# Patient Record
Sex: Female | Born: 1935 | Race: White | Hispanic: No | Smoking: Former smoker
Health system: Southern US, Community
[De-identification: ages and names within clinical notes are randomized; demographics above are authoritative.]

## PROBLEM LIST (undated history)

## (undated) DIAGNOSIS — F0390 Unspecified dementia without behavioral disturbance: Secondary | ICD-10-CM

## (undated) HISTORY — PX: ABDOMINAL HYSTERECTOMY: SHX81

## (undated) HISTORY — PX: TOTAL HIP ARTHROPLASTY: SHX124

---

## 2004-08-21 ENCOUNTER — Ambulatory Visit: Payer: Self-pay | Admitting: Otolaryngology

## 2005-03-27 ENCOUNTER — Ambulatory Visit: Payer: Self-pay | Admitting: Internal Medicine

## 2006-12-02 ENCOUNTER — Other Ambulatory Visit: Payer: Self-pay

## 2006-12-02 ENCOUNTER — Observation Stay: Payer: Self-pay | Admitting: Internal Medicine

## 2008-03-07 ENCOUNTER — Ambulatory Visit: Payer: Self-pay | Admitting: Internal Medicine

## 2015-10-22 ENCOUNTER — Encounter: Payer: Self-pay | Admitting: Intensive Care

## 2015-10-22 ENCOUNTER — Emergency Department
Admission: EM | Admit: 2015-10-22 | Discharge: 2015-10-22 | Disposition: A | Discharge status: Home / Self Care | Payer: Medicare PPO | Attending: Emergency Medicine | Admitting: Emergency Medicine

## 2015-10-22 ENCOUNTER — Emergency Department: Payer: Medicare PPO

## 2015-10-22 DIAGNOSIS — Y999 Unspecified external cause status: Secondary | ICD-10-CM | POA: Insufficient documentation

## 2015-10-22 DIAGNOSIS — Y9389 Activity, other specified: Secondary | ICD-10-CM | POA: Insufficient documentation

## 2015-10-22 DIAGNOSIS — Z90710 Acquired absence of both cervix and uterus: Secondary | ICD-10-CM | POA: Insufficient documentation

## 2015-10-22 DIAGNOSIS — F0390 Unspecified dementia without behavioral disturbance: Secondary | ICD-10-CM | POA: Diagnosis not present

## 2015-10-22 DIAGNOSIS — Y92012 Bathroom of single-family (private) house as the place of occurrence of the external cause: Secondary | ICD-10-CM | POA: Insufficient documentation

## 2015-10-22 DIAGNOSIS — W01198A Fall on same level from slipping, tripping and stumbling with subsequent striking against other object, initial encounter: Secondary | ICD-10-CM | POA: Insufficient documentation

## 2015-10-22 DIAGNOSIS — S0990XA Unspecified injury of head, initial encounter: Secondary | ICD-10-CM | POA: Diagnosis not present

## 2015-10-22 DIAGNOSIS — Z87891 Personal history of nicotine dependence: Secondary | ICD-10-CM | POA: Diagnosis not present

## 2015-10-22 HISTORY — DX: Unspecified dementia, unspecified severity, without behavioral disturbance, psychotic disturbance, mood disturbance, and anxiety: F03.90

## 2015-10-22 NOTE — ED Notes (Signed)
Patient arrived by EMS from home. Patient has advanced dementia and constantly grinds her teethe. Patient was at home and had been placed on the toilet. Patient attempted to stand up by herself and fell tot he floor and hit her head. Patient has hematoma on L forehead. Family denies patient being on blood thinners or LOC.

## 2015-10-22 NOTE — ED Notes (Addendum)
Pt s/p fall with raised bruise over left eye. Pupils 4mm bilaterally and reactive. No other obvious injuries noted but pt is right side down and resists turning.  Hx dementia. Pt at baseline per daughter.

## 2015-10-22 NOTE — ED Provider Notes (Signed)
[facility] Department Provider Note  ____________________________________________   I have reviewed the triage vital signs and the nursing notes.   HISTORY  Chief Complaint Fall    HPI Erin NatterSarah V Carter is a 82 y.o. female who lives at home. Severe dementia. She is baseline nonverbal essentially. Patient had a witnessed fall from transfer at the toilet. Hit her head did not pass out. Patient herself cannot give a history. History per family. I thing in her baseline at this time. No injury noted aside from hematoma. Was able to bear weight for EMS.      Past Medical History  Diagnosis Date  . Dementia     There are no active problems to display for this patient.   Past Surgical History  Procedure Laterality Date  . Abdominal hysterectomy    . Total hip arthroplasty      family unsure which side    No current outpatient prescriptions on file.  Allergies Review of patient's allergies indicates no known allergies.  History reviewed. No pertinent family history.  Social History Social History  Substance Use Topics  . Smoking status: Former Games developermoker  . Smokeless tobacco: Never Used  . Alcohol Use: No    Review of Systems Cannot obtain second patient mental status  ____________________________________________   PHYSICAL EXAM:  VITAL SIGNS: ED Triage Vitals  Enc Vitals Group     BP 10/22/15 1552 162/79 mmHg     Pulse Rate 10/22/15 1546 85     Resp 10/22/15 1546 22     Temp 10/22/15 1546 97.7 F (36.5 C)     Temp Source 10/22/15 1546 Axillary     SpO2 10/22/15 1546 99 %     Weight 10/22/15 1546 160 lb 15 oz (73 kg)     Height --      Head Cir --      Peak Flow --      Pain Score --      Pain Loc --      Pain Edu? --      Excl. in GC? --     Constitutional: Alert in no acute distress, pleasantly demented Eyes: Conjunctivae are normal. PERRL. EOMI. Head: Hematoma noted to the left forehead no skull  fracture palpated. Nose: No congestion/rhinnorhea. Mouth/Throat: Mucous membranes are moist.  Oropharynx non-erythematous. Neck: No stridor.   Nontender with no meningismus Cardiovascular: Normal rate, regular rhythm. Grossly normal heart sounds.  Good peripheral circulation. Respiratory: Normal respiratory effort.  No retractions. Lungs CTAB. Abdominal: Soft and nontender. No distention. No guarding no rebound Back:  There is no focal tenderness or step off there is no midline tenderness there are no lesions noted. there is no CVA tenderness Musculoskeletal: No lower extremity tenderness. No joint effusions, no DVT signs strong distal pulses no edema Neurologic:  Normal speech and language. No gross focal neurologic deficits are appreciated.  Skin:  Skin is warm, dry and intact. No rash noted.  ____________________________________________   LABS (all labs ordered are listed, but only abnormal results are displayed)  Labs Reviewed - No data to display ____________________________________________  EKG  I personally interpreted any EKGs ordered by me or triage  ____________________________________________  RADIOLOGY  I reviewed any imaging ordered by me or triage that were performed during my shift and, if possible, patient and/or family made aware of any abnormal findings. ____________________________________________   PROCEDURES  Procedure(s) performed: None  Critical Care performed: None  ____________________________________________   INITIAL IMPRESSION /  ASSESSMENT AND PLAN / ED COURSE  Pertinent labs & imaging results that were available during my care of the patient were reviewed by me and considered in my medical decision making (see chart for details).  Non-syncopal fall no evidence of other injury aside from close head injury, CT negative, not on anticoagulation, able to bear weight, no evidence of hip fracture or other occult fracture but very difficult to  interpret this patient's exam. Extensive return precautions therefore given to family. They're very comfortable with discharge. Patient remains at her baseline and they're eager to go home. ____________________________________________   FINAL CLINICAL IMPRESSION(S) / ED DIAGNOSES  Final diagnoses:  Closed head injury, initial encounter      This chart was dictated using voice recognition software.  Despite best efforts to proofread,  errors can occur which can change meaning.      Jeanmarie Plant, MD 10/22/15 [phone]

## 2017-07-14 DEATH — deceased

## 2017-10-12 IMAGING — CT CT CERVICAL SPINE W/O CM
2 of 4 series · 11 of 29 positions shown, 14 images · non-contrast
Comparison: 12/02/2006 head CT

CLINICAL DATA: Fall with bruising over the left eye. Dementia.
Initial encounter.

EXAM:
CT HEAD WITHOUT CONTRAST
CT CERVICAL SPINE WITHOUT CONTRAST
TECHNIQUE: Multidetector CT imaging of the head and cervical spine was
performed following the standard protocol without intravenous
contrast. Multiplanar CT image reconstructions of the cervical spine
were also generated.

[Series 5: soft tissue · axial · 0.38mm/px · z∈[-260,-154]mm · 6 of 75 slices shown, 8 images]
[im 11/75  soft-tissue]
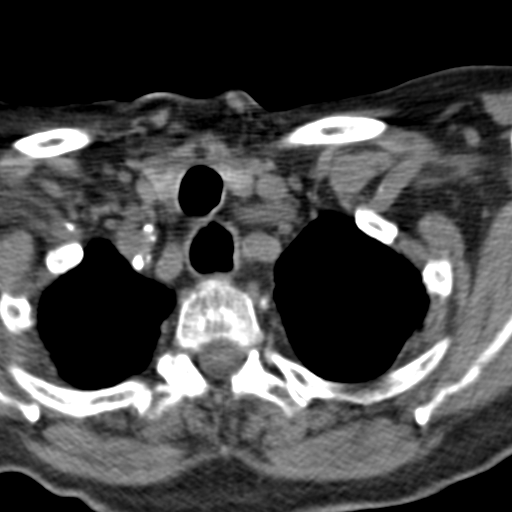
[im 11/75  bone]
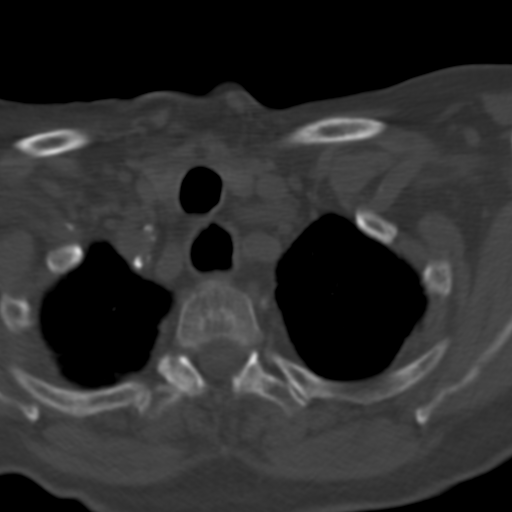
[im 22/75  bone]
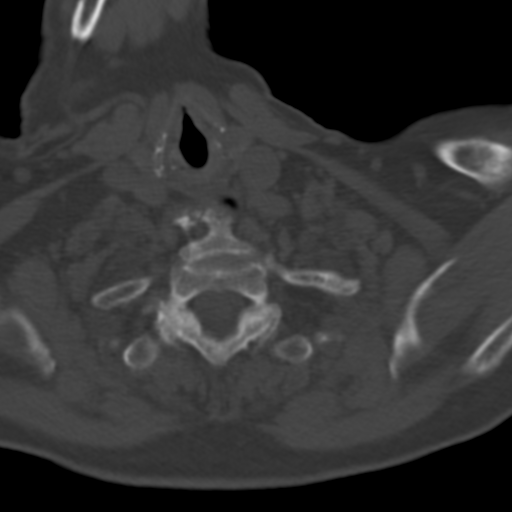
[im 32/75  bone]
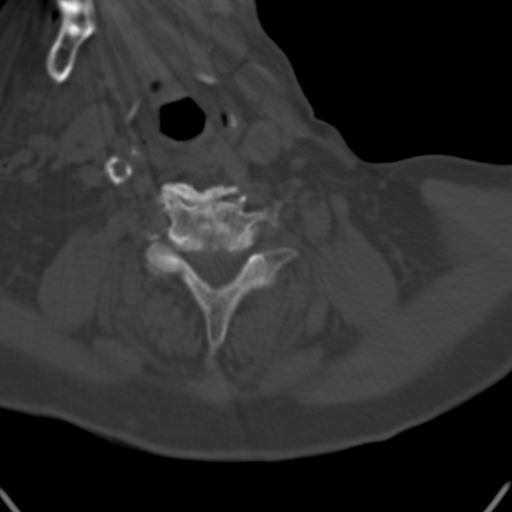
[im 43/75  bone]
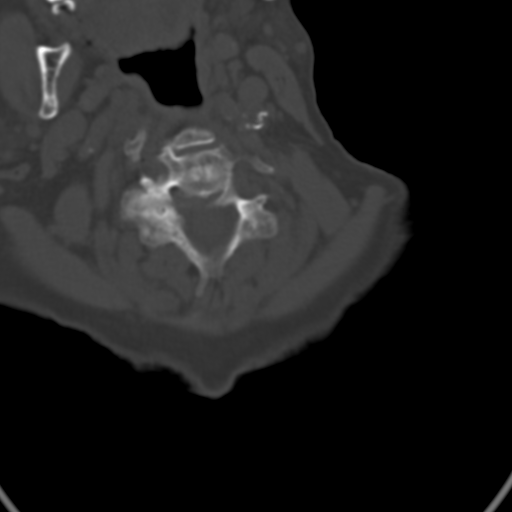
[im 53/75  soft-tissue]
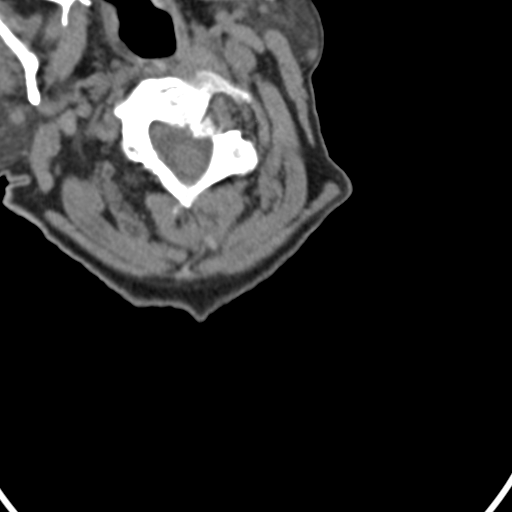
[im 53/75  bone]
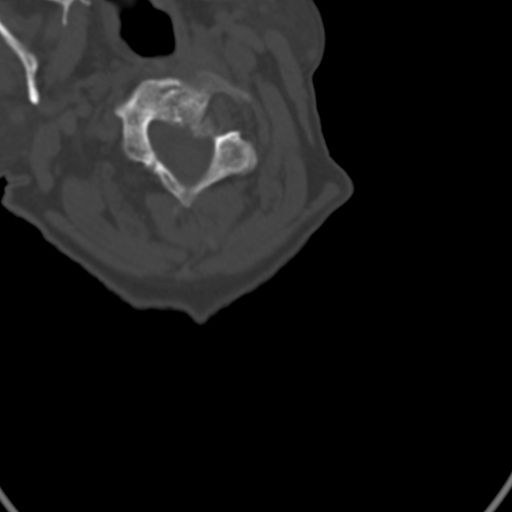
[im 64/75  bone]
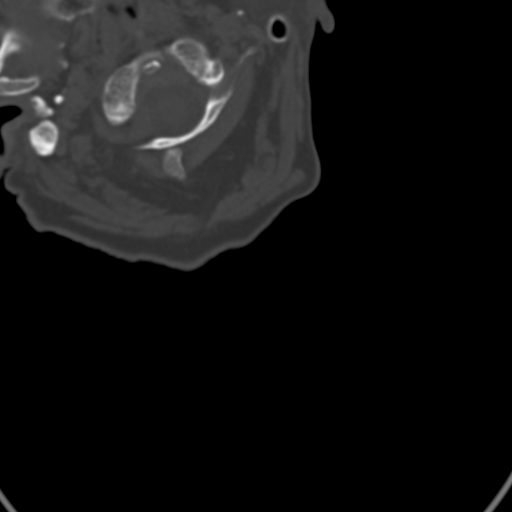

[Series 6: sagittal bone · sagittal · 0.22mm/px · 5 of 49 slices shown, 6 images]
[im 17/49  bone]
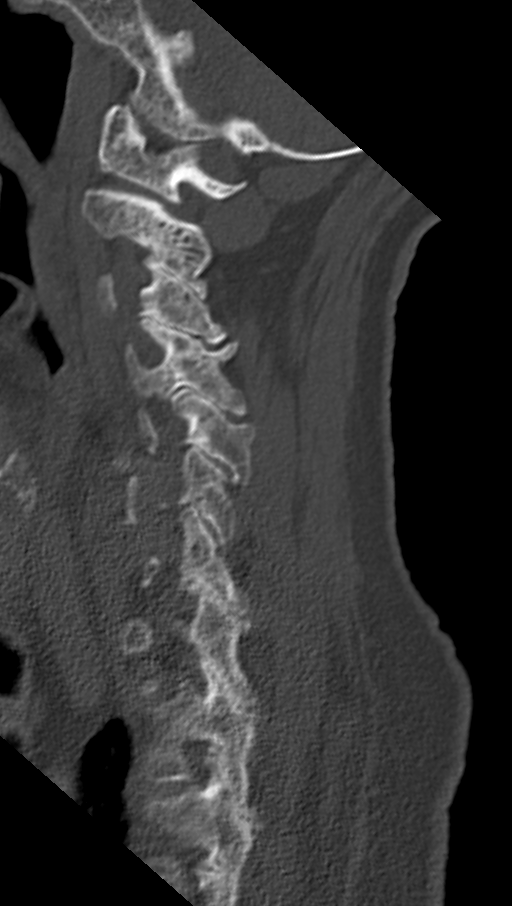
[im 21/49  bone]
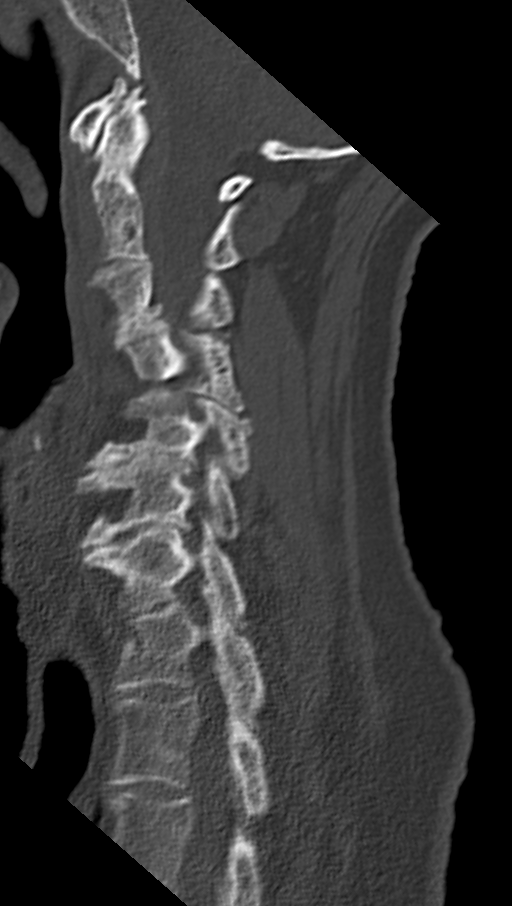
[im 25/49  soft-tissue]
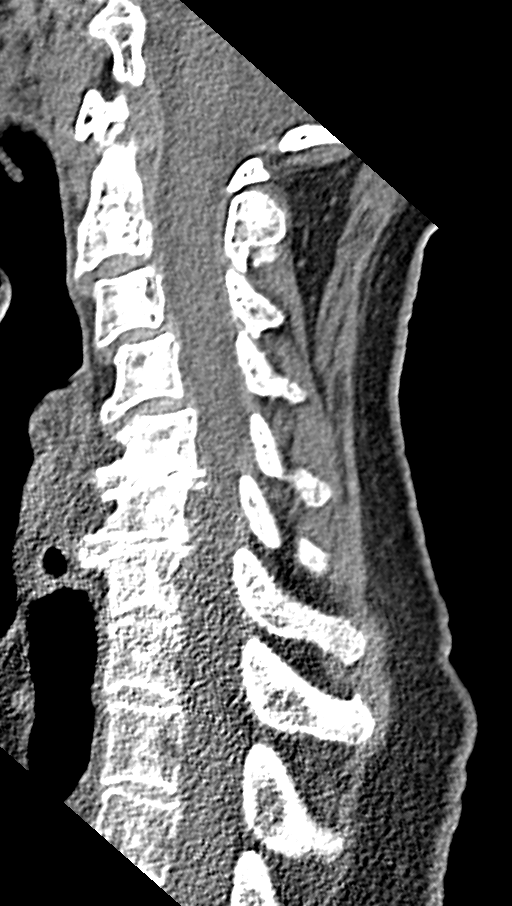
[im 25/49  bone]
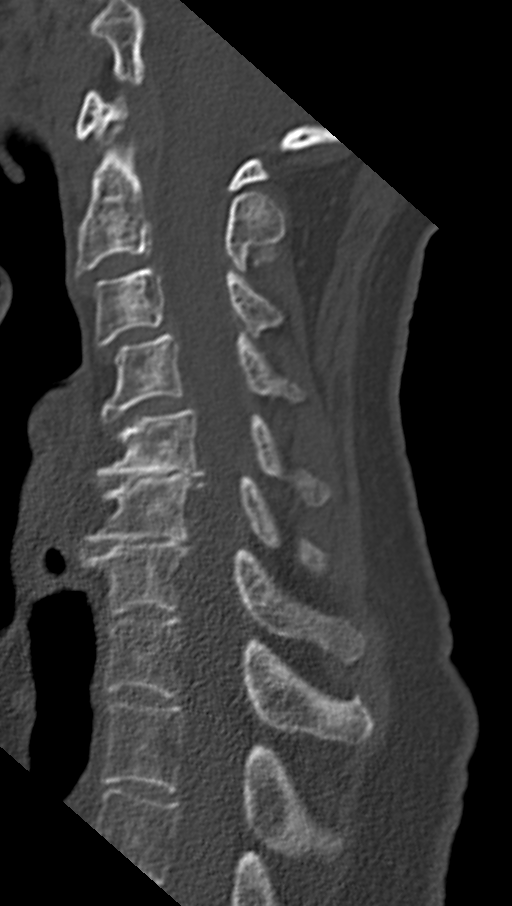
[im 29/49  bone]
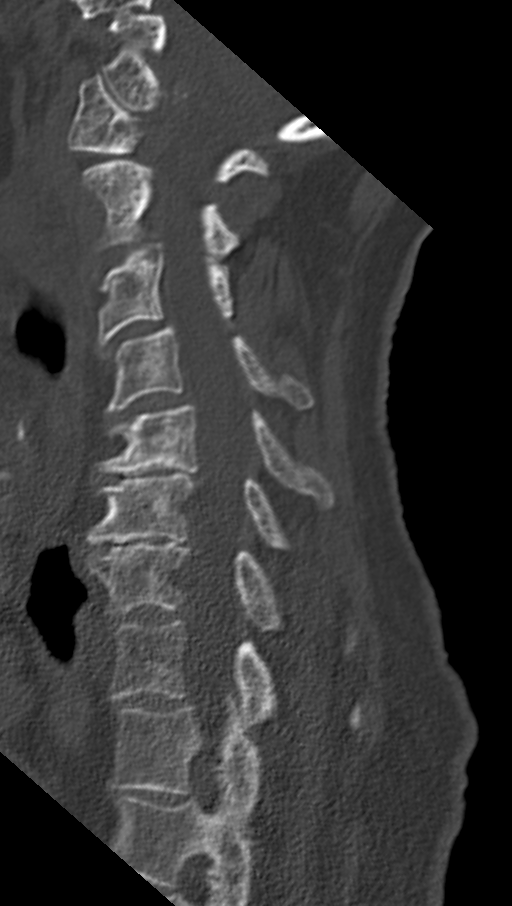
[im 33/49  bone]
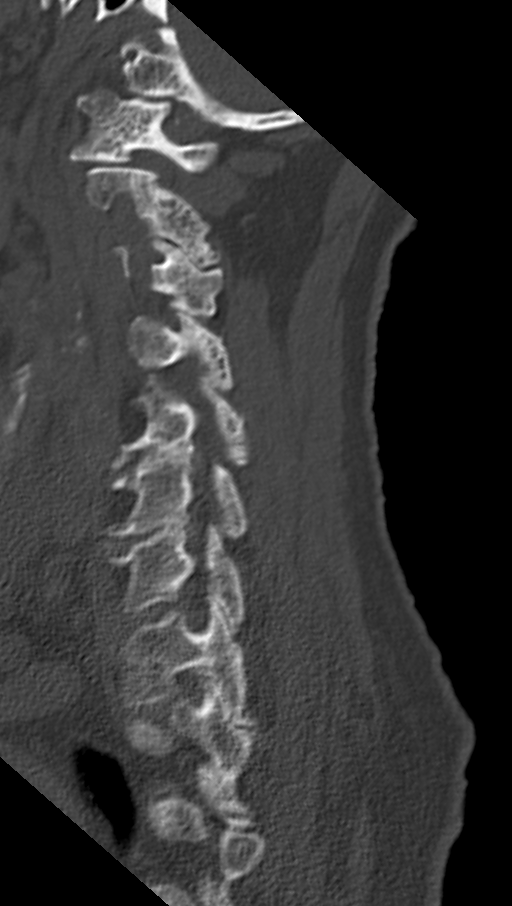

[11 of 29 positions shown; findings below may reference images not displayed]

FINDINGS: CT HEAD FINDINGS

Skull and Sinuses:Large left frontotemporal scalp hematoma. No
fracture or opaque foreign body. No visualized hemo sinus

Visualized orbits: No acute or traumatic finding

Brain: No evidence of acute infarction, hemorrhage, obstructive
hydrocephalus, or mass lesion/mass effect. Advanced atrophy which
has markedly progressed from prior. The medial temporal lobes are
severely atrophic, an Alzheimer's disease pattern. Chronic small
vessel disease with confluent ischemic gliosis in the deep cerebral
white matter.

CT CERVICAL SPINE FINDINGS

Negative for acute fracture or subluxation. No prevertebral edema.
No gross cervical canal hematoma.

Multilevel cervical disc degeneration and facet arthropathy with
degenerative appearing mild anterolisthesis at C3-4 and C4-5. No
evidence of high-grade canal stenosis.
IMPRESSION: 1. No evidence of acute intracranial or cervical spine injury.
2. Left anterior scalp hematoma without fracture.
3. Severe atrophy correlating with history of dementia.
